# Patient Record
Sex: Male | Born: 1975 | Race: White | Hispanic: No | Marital: Married | Smoking: Never smoker
Health system: Southern US, Community
[De-identification: ages and names within clinical notes are randomized; demographics above are authoritative.]

## PROBLEM LIST (undated history)

## (undated) DIAGNOSIS — E785 Hyperlipidemia, unspecified: Secondary | ICD-10-CM

## (undated) DIAGNOSIS — E7800 Pure hypercholesterolemia, unspecified: Secondary | ICD-10-CM

## (undated) DIAGNOSIS — I10 Essential (primary) hypertension: Secondary | ICD-10-CM

## (undated) HISTORY — DX: Essential (primary) hypertension: I10

## (undated) HISTORY — DX: Hyperlipidemia, unspecified: E78.5

## (undated) HISTORY — DX: Pure hypercholesterolemia, unspecified: E78.00

---

## 2001-08-19 ENCOUNTER — Encounter: Admission: RE | Admit: 2001-08-19 | Discharge: 2001-08-19 | Payer: Self-pay | Admitting: Internal Medicine

## 2001-08-19 ENCOUNTER — Encounter: Payer: Self-pay | Admitting: Internal Medicine

## 2006-01-14 ENCOUNTER — Ambulatory Visit: Payer: Self-pay | Admitting: Internal Medicine

## 2016-11-29 ENCOUNTER — Ambulatory Visit (INDEPENDENT_AMBULATORY_CARE_PROVIDER_SITE_OTHER): Payer: 59 | Admitting: Sports Medicine

## 2016-11-29 ENCOUNTER — Ambulatory Visit
Admission: RE | Admit: 2016-11-29 | Discharge: 2016-11-29 | Disposition: A | Discharge status: Home / Self Care | Payer: 59 | Source: Ambulatory Visit | Attending: Sports Medicine | Admitting: Sports Medicine

## 2016-11-29 ENCOUNTER — Ambulatory Visit: Payer: Self-pay

## 2016-11-29 ENCOUNTER — Encounter: Payer: Self-pay | Admitting: Sports Medicine

## 2016-11-29 VITALS — BP 142/97 | HR 73 | Ht 68.0 in | Wt 180.0 lb

## 2016-11-29 DIAGNOSIS — M722 Plantar fascial fibromatosis: Secondary | ICD-10-CM | POA: Diagnosis not present

## 2016-11-29 DIAGNOSIS — M79672 Pain in left foot: Secondary | ICD-10-CM

## 2016-11-29 DIAGNOSIS — M7732 Calcaneal spur, left foot: Secondary | ICD-10-CM | POA: Diagnosis not present

## 2016-11-30 DIAGNOSIS — M79672 Pain in left foot: Secondary | ICD-10-CM | POA: Insufficient documentation

## 2016-11-30 NOTE — Progress Notes (Signed)
  Mark Jenkins - 29[name]41 y.o. male MRN [id number]  Date of birth: 11/12/1975  SUBJECTIVE:  Including CC & ROS.  CC: left foot pain  [name] is a 41 yo male who is presenting with intermittent left heel pain for the past 8 months.  He does not remember a specific injury, but first felt the pain when he was playing soccer. He reports that the pain is worse on the lateral edge of his heel. It bothers him most in the morning when he first wakes up, then again at night after he has been on his feet all day. He has worn heel pads in his shoes and trialed a plantar fascia support brace which has helped minimally. He ices his foot occasionally after tennis matches and soccer games. No prior foot injuries. No other injuries reported.  ROS: No unexpected weight loss, fever, chills, swelling, instability, muscle pain, numbness/tingling, redness, otherwise see HPI   PMHx - Updated and reviewed.  Contributory factors include: Negative PSHx - Updated and reviewed.  Contributory factors include:  Negative FHx - Updated and reviewed.  Contributory factors include:  Negative Social Hx - Updated and reviewed. Contributory factors include: Negative Medications - reviewed   DATA REVIEWED: N/A  PHYSICAL EXAM:  VS: BP:(!) 142/97  HR:73bpm  TEMP: ( )  RESP:   HT:5\' 8"  (172.7 cm)   WT:180 lb (81.6 kg)  BMI:27.4 PHYSICAL EXAM: Gen: NAD, alert, cooperative with exam, well-appearing HEENT: clear conjunctiva,  CV:  no edema, capillary refill brisk, normal rate Resp: non-labored Skin: no rashes, normal turgor  Neuro: no gross deficits.  Psych:  alert and oriented  Left Foot: No visible erythema or swelling. Range of motion is full in all directions. Strength is 5/5 in all directions. No hallux valgus or abnormal calluses. Pes cavus noted No pain over the navicular prominence, or base of fifth metatarsal Moderate tenderness to palpation of the calcaneal insertion of plantar fascia but more tenderness  over lateral calcaneus. No pain at the Achilles insertion. No pain over the calcaneal bursa. No pain of the retrocalcaneal bursa. No tenderness to palpation over the tarsals, metatarsals, or phalanges. No tenderness palpation over interphalangeal joints. No pain with compression of the metatarsal heads. Neurovascularly intact distally.  Imaging:  U/S L foot: Thickening of plantar fascia noted on left compared to right. Left calcaneus with step off deformity  ASSESSMENT & PLAN:   Left foot pain Left foot pain most consistent with plantar fascitis, given nature of symptoms and thickening of plantar fascia on ultrasound. Although his point tenderness is more lateral than typical pain in plantar fascitis. Given deformity of calcaneus on left, he could also have a stress fracture that is causing pain as well.   Plan: Given rehab exercises for plantar fascitis, instructed to continue to wear shoes with arch support Return in 4 weeks  Patient seen and evaluated with the resident. I agree with the above plan of care. The cortical irregularity seen on his ultrasound is a calcaneal spur. This is confirmed on his x-ray. There is no evidence of stress fracture. Patient will proceed with treatment as above and follow-up in 4 weeks. If he continues to have improvement then we could consider custom orthotics at follow-up.

## 2016-11-30 NOTE — Assessment & Plan Note (Signed)
Left foot pain most consistent with plantar fascitis, given nature of symptoms and thickening of plantar fascia on ultrasound. Although his point tenderness is more lateral than typical pain in plantar fascitis. Given deformity of calcaneus on left, he could also have a stress fracture that is causing pain as well.   Plan: Given rehab exercises for plantar fascitis, instructed to continue to wear shoes with arch support Return in 4 weeks

## 2016-12-27 ENCOUNTER — Ambulatory Visit: Payer: 59 | Admitting: Sports Medicine

## 2017-08-22 IMAGING — CR DG OS CALCIS 2+V*L*
2 series · 2 of 2 positions shown · non-contrast
Comparison: None.

CLINICAL DATA: Left heel pain for 7 months, no injury

EXAM:
LEFT OS CALCIS - 2+ VIEW

[t calcaneus axial left]
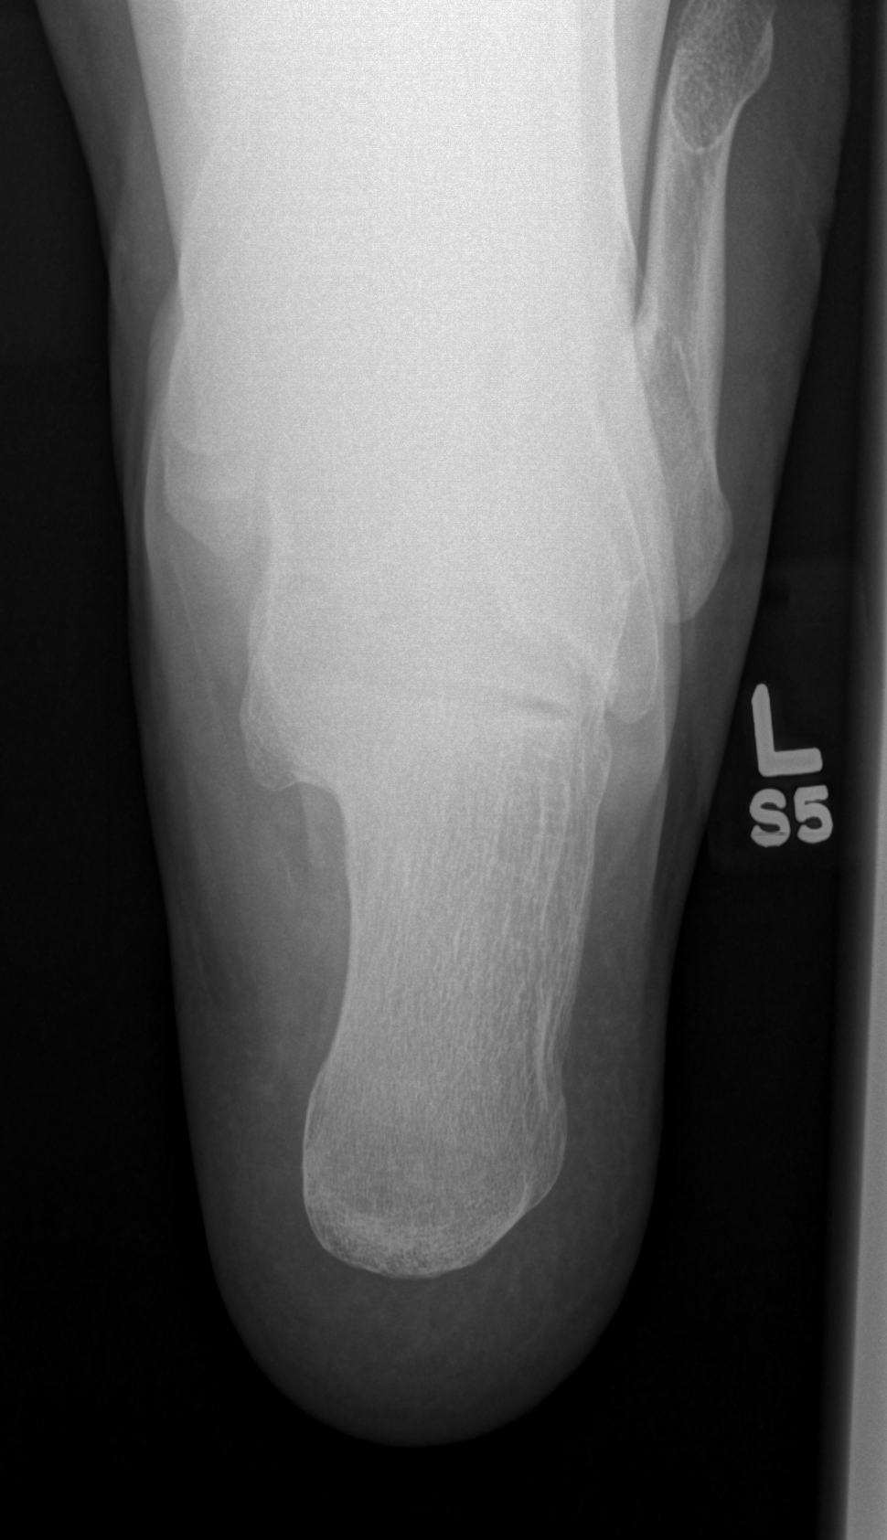

[t calcaneus lat left]
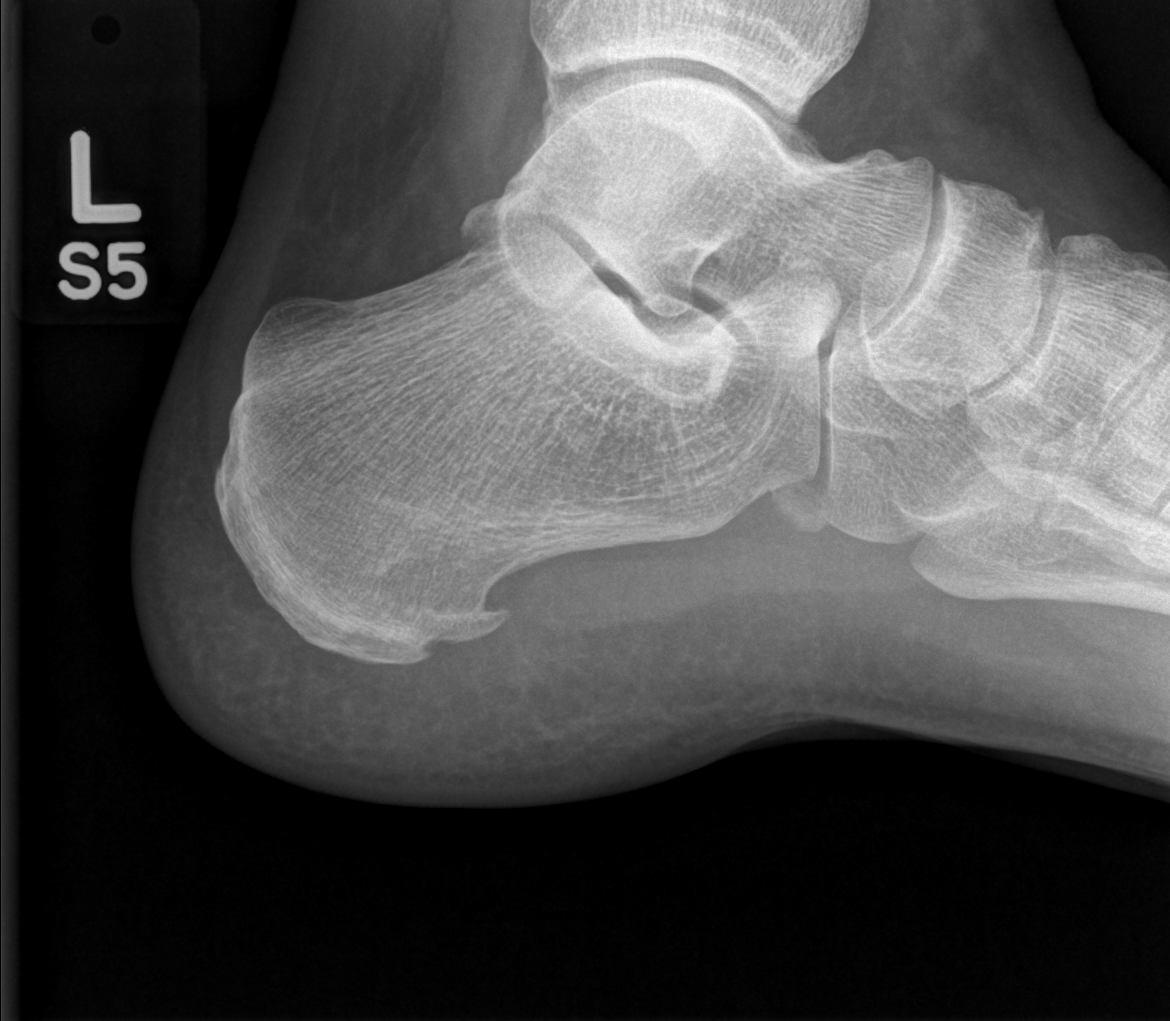

[2 of 2 positions shown; findings below may reference images not displayed]

FINDINGS: There is a small plantar calcaneal degenerative spur present. No
acute abnormality is seen. The sub talar joint space appears normal.
IMPRESSION: Small plantar calcaneal degenerative spur.

## 2022-02-28 ENCOUNTER — Ambulatory Visit: Payer: 59 | Admitting: Gastroenterology

## 2022-02-28 NOTE — Progress Notes (Deleted)
    Gastroenterology Consultation  Referring Provider:     Haywood Pao, MD Primary Care Physician:  Haywood Pao, MD Primary Gastroenterologist:  Dr. Allen Norris     Reason for Consultation:     Change in bowel habits        HPI:   Mark Jenkins is a 46 y.o. y/o male referred for consultation & management of change in bowel habits by Dr. Osborne Casco, Fransico Him, MD. this patient comes in after making appointment because he reported not having a solid bowel movement for few weeks.  At the time he had stated that he was having abdominal pain when he would eat too fast or eat pizza  No past medical history on file.  *** The histories are not reviewed yet. Please review them in the "History" navigator section and refresh this [place].  Prior to Admission medications   Medication Sig Start Date End Date Taking? Authorizing Provider  atorvastatin (LIPITOR) 40 MG tablet  11/01/16   [provider]    No family history on file.   Social History   Tobacco Use   Smoking status: Never   Smokeless tobacco: Never    Allergies as of 02/28/2022   (Not on File)    Review of Systems:    All systems reviewed and negative except where noted in HPI.   Physical Exam:  There were no vitals taken for this visit. No LMP for male patient. General:   Alert,  Well-developed, well-nourished, pleasant and cooperative in NAD Head:  Normocephalic and atraumatic. Eyes:  Sclera clear, no icterus.   Conjunctiva pink. Ears:  Normal auditory acuity. Neck:  Supple; no masses or thyromegaly. Lungs:  Respirations even and unlabored.  Clear throughout to auscultation.   No wheezes, crackles, or rhonchi. No acute distress. Heart:  Regular rate and rhythm; no murmurs, clicks, rubs, or gallops. Abdomen:  Normal bowel sounds.  No bruits.  Soft, non-tender and non-distended without masses, hepatosplenomegaly or hernias noted.  No guarding or rebound tenderness.  Negative Carnett sign.   Rectal:   Deferred.  Pulses:  Normal pulses noted. Extremities:  No clubbing or edema.  No cyanosis. Neurologic:  Alert and oriented x3;  grossly normal neurologically. Skin:  Intact without significant lesions or rashes.  No jaundice. Lymph Nodes:  No significant cervical adenopathy. Psych:  Alert and cooperative. Normal mood and affect.  Imaging Studies: No results found.  Assessment and Plan:   MARWIN COTE is a 46 y.o. y/o male ***    Lucilla Lame, MD. Marval Regal    Note: This dictation was prepared with Dragon dictation along with smaller phrase technology. Any transcriptional errors that result from this process are unintentional.

## 2022-05-24 ENCOUNTER — Encounter: Payer: Self-pay | Admitting: *Deleted

## 2022-06-12 ENCOUNTER — Encounter: Payer: Self-pay | Admitting: Gastroenterology

## 2022-06-22 ENCOUNTER — Ambulatory Visit: Payer: 59 | Attending: Internal Medicine | Admitting: Internal Medicine

## 2022-06-22 ENCOUNTER — Encounter: Payer: Self-pay | Admitting: Internal Medicine

## 2022-06-22 VITALS — BP 146/82 | HR 56 | Ht 67.0 in | Wt 198.0 lb

## 2022-06-22 DIAGNOSIS — E785 Hyperlipidemia, unspecified: Secondary | ICD-10-CM

## 2022-06-22 DIAGNOSIS — Z7689 Persons encountering health services in other specified circumstances: Secondary | ICD-10-CM | POA: Diagnosis not present

## 2022-06-22 LAB — LIPID PANEL
Chol/HDL Ratio: 6.2 ratio — ABNORMAL HIGH (ref 0.0–5.0)
Cholesterol, Total: 273 mg/dL — ABNORMAL HIGH (ref 100–199)
HDL: 44 mg/dL (ref >39)
LDL Chol Calc (NIH): 194 mg/dL — ABNORMAL HIGH (ref 0–99)
Triglycerides: 183 mg/dL — ABNORMAL HIGH (ref 0–149)
VLDL Cholesterol Cal: 35 mg/dL (ref 5–40)

## 2022-06-22 NOTE — Progress Notes (Signed)
Cardiology Office Note:    Date:  06/22/2022   ID:  Mark Jenkins, DOB May 19, 1976, MRN [id number]  PCP:  Gaspar Garbe, MD   [facility] Providers Cardiologist:  Maisie Fus, MD     Referring MD: Gaspar Garbe, MD   No chief complaint on file. Hyperlipidemia  History of Present Illness:    Mark Jenkins is a 46 y.o. 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 He plays tennis, pickle ball and soccer.  He has no DOE and no chest pressure. Normal LFTs. No family hx of CAD. His father had atrial fibrillation. Has a sister and she is healthy. No prior cardiac work up.   03/23/2022 TC 315 LDL 253 HDL 37  Current Medications: Current Meds  Medication Sig   atorvastatin (LIPITOR) 40 MG tablet      Allergies:   Patient has no allergy information on record.   Social History   Socioeconomic History   Marital status: Married    Spouse name: Not on file   Number of children: Not on file   Years of education: Not on file   Highest education level: Not on file  Occupational History   Not on file  Tobacco Use   Smoking status: Never   Smokeless tobacco: Never  Substance and Sexual Activity   Alcohol use: Not on file   Drug use: Not on file   Sexual activity: Not on file  Other Topics Concern   Not on file  Social History Narrative   Not on file   Social Determinants of Health   Financial Resource Strain: Not on file  Food Insecurity: Not on file  Transportation Needs: Not on file  Physical Activity: Not on file  Stress: Not on file  Social Connections: Not on file     Family History: Per above  ROS:   Please see the history of present illness.     All other systems reviewed and are negative.  EKGs/Labs/Other Studies Reviewed:    The following studies were reviewed today:   EKG:  EKG is  ordered today.  The ekg ordered today demonstrates   06/22/2022-Sinus  bradycardia  with sinus arrhythmia   Recent Labs: No results found for requested labs within last 365 days.   Recent Lipid Panel No results found for: "CHOL", "TRIG", "HDL", "CHOLHDL", "VLDL", "LDLCALC", "LDLDIRECT"   Risk Assessment/Calculations:     Physical Exam:    VS:    Vitals:   06/22/22 0931  BP: (!) 146/82  Pulse: (!) 56  SpO2: 98%     Wt Readings from Last 3 Encounters:  06/22/22 198 lb (89.8 kg)  11/29/16 180 lb (81.6 kg)     GEN:  Well nourished, well developed in no acute distress HEENT: Normal NECK: No JVD; No carotid bruits LYMPHATICS: No lymphadenopathy CARDIAC: RRR, no murmurs, rubs, gallops RESPIRATORY:  Clear to auscultation without rales, wheezing or rhonchi  ABDOMEN: Soft, non-tender, non-distended MUSCULOSKELETAL:  No edema; No deformity  SKIN: Warm and dry NEUROLOGIC:  Alert and oriented x 3 PSYCHIATRIC:  Normal affect   ASSESSMENT:    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 He is asymptomatic. Continue atorvastatin 40 mg daily. Will re-assess his lipids and adjust his statin accordingly. PLAN:    In order of problems listed above:  Fasting labs      Medication Adjustments/Labs and Tests Ordered: Current medicines are reviewed  at length with the patient today.  Concerns regarding medicines are outlined above.  Orders Placed This Encounter  Procedures   Lipid panel   EKG 12-Lead   No orders of the defined types were placed in this encounter.   Patient Instructions  Medication Instructions:  Your physician recommends that you continue on your current medications as directed. Please refer to the Current Medication list given to you today.  *If you need a refill on your cardiac medications before your next appointment, please call your pharmacy*   Lab Work: TODAY: Lipids If you have labs (blood work) drawn today and your tests are completely normal, you will receive your results only by: MyChart Message (if you have MyChart) OR A paper  copy in the mail If you have any lab test that is abnormal or we need to change your treatment, we will call you to review the results.   Testing/Procedures: None   Follow-Up: At [facility], you and your health needs are our priority.  As part of our continuing mission to provide you with exceptional heart care, we have created designated Provider Care Teams.  These Care Teams include your primary Cardiologist (physician) and Advanced Practice Providers (APPs -  Physician Assistants and Nurse Practitioners) who all work together to provide you with the care you need, when you need it.  We recommend signing up for the patient portal called "MyChart".  Sign up information is provided on this After Visit Summary.  MyChart is used to connect with patients for Virtual Visits (Telemedicine).  Patients are able to view lab/test results, encounter notes, upcoming appointments, etc.  Non-urgent messages can be sent to your provider as well.   To learn more about what you can do with MyChart, go to [email or web address].    Your next appointment:   6 month(s)  The format for your next appointment:   In Person  Provider:   Maisie Fus, MD     Other Instructions   Important Information About Sugar         Signed, Maisie Fus, MD  06/22/2022 10:02 AM    [facility]

## 2022-06-22 NOTE — Patient Instructions (Signed)
Medication Instructions:  Your physician recommends that you continue on your current medications as directed. Please refer to the Current Medication list given to you today.  *If you need a refill on your cardiac medications before your next appointment, please call your pharmacy*   Lab Work: TODAY: Lipids If you have labs (blood work) drawn today and your tests are completely normal, you will receive your results only by: MyChart Message (if you have MyChart) OR A paper copy in the mail If you have any lab test that is abnormal or we need to change your treatment, we will call you to review the results.   Testing/Procedures: None   Follow-Up: At [facility], you and your health needs are our priority.  As part of our continuing mission to provide you with exceptional heart care, we have created designated Provider Care Teams.  These Care Teams include your primary Cardiologist (physician) and Advanced Practice Providers (APPs -  Physician Assistants and Nurse Practitioners) who all work together to provide you with the care you need, when you need it.  We recommend signing up for the patient portal called "MyChart".  Sign up information is provided on this After Visit Summary.  MyChart is used to connect with patients for Virtual Visits (Telemedicine).  Patients are able to view lab/test results, encounter notes, upcoming appointments, etc.  Non-urgent messages can be sent to your provider as well.   To learn more about what you can do with MyChart, go to [email or web address].    Your next appointment:   6 month(s)  The format for your next appointment:   In Person  Provider:   Maisie Fus, MD     Other Instructions   Important Information About Sugar

## 2022-07-02 ENCOUNTER — Other Ambulatory Visit: Payer: Self-pay | Admitting: *Deleted

## 2022-07-02 DIAGNOSIS — E785 Hyperlipidemia, unspecified: Secondary | ICD-10-CM

## 2022-07-02 MED ORDER — ATORVASTATIN CALCIUM 80 MG PO TABS: 80.0000 mg | ORAL_TABLET | Freq: Every day | ORAL | 3 refills | Status: AC

## 2022-07-25 ENCOUNTER — Encounter: Payer: Self-pay | Admitting: Gastroenterology

## 2022-07-25 ENCOUNTER — Ambulatory Visit: Payer: 59 | Admitting: Gastroenterology

## 2022-07-25 VITALS — BP 132/82 | HR 72 | Ht 67.0 in | Wt 201.2 lb

## 2022-07-25 DIAGNOSIS — R1319 Other dysphagia: Secondary | ICD-10-CM

## 2022-07-25 DIAGNOSIS — Z1211 Encounter for screening for malignant neoplasm of colon: Secondary | ICD-10-CM

## 2022-07-25 DIAGNOSIS — R12 Heartburn: Secondary | ICD-10-CM

## 2022-07-25 MED ORDER — NA SULFATE-K SULFATE-MG SULF 17.5-3.13-1.6 GM/177ML PO SOLN: 1.0000 | Freq: Once | ORAL | 0 refills | Status: AC

## 2022-07-25 MED ORDER — ONDANSETRON HCL 4 MG PO TABS: ORAL_TABLET | ORAL | 0 refills | Status: AC

## 2022-07-25 NOTE — Patient Instructions (Addendum)
It was my pleasure to provide care to you today. Based on our discussion, I am providing you with my recommendations below:  RECOMMENDATION(S):   Continue to take your omeprazole every day.  Take small bites, eat slowly, and chew well.  Take sips of water between bites.  Remain upright after eating. You should maintain postures that reduce the risk for reflux for at least three hours after eating. For example, don't bend over or strain to lift heavy objects.  Avoid eating within three hours of going to bed. Lying down after eating will increase chances of reflux.  Lose weight. Excess pounds increase pressure on the stomach and can push acid into the esophagus.  Loosen up. Avoid tight belts, waistbands, and other clothing that puts pressure on your stomach.  Avoid foods that burn. Abstain from food or drink that increases gastric acid secretion, decreases the valve at the bottom of the esophagus, or slows the emptying of the stomach. Known offenders include high-fat foods, spicy dishes, tomatoes and tomato products, citrus fruits, garlic, onions, milk, carbonated drinks, coffee (including decaf), tea, chocolate, mints, and alcohol.  Sleep at an angle. If you're bothered by nighttime heartburn, place a wedge (available in medical supply stores or a wedge pillow through [employer]) under your upper body. But don't elevate your head with extra pillows. That makes reflux worse by bending you at the waist and compressing your stomach. You might also try sleeping on your left side, as studies have shown this can help--perhaps because the stomach is on the left side of the body, so lying on your left positions most of the stomach below the bottom of the esophagus.   COLONOSCOPY:   You have been scheduled for a colonoscopy. Please follow written instructions given to you at your visit today.   PREP:   Please pick up your prep supplies at the pharmacy within the next 1-3 days.  INHALERS:   If you use  inhalers (even only as needed), please bring them with you on the day of your procedure.   COLONOSCOPY TIPS:  To reduce nausea and dehydration, stay well hydrated for 3-4 days prior to the exam.  To prevent skin/hemorrhoid irritation - prior to wiping, put A&Dointment or vaseline on the toilet paper. Keep a towel or pad on the bed.  BEFORE STARTING YOUR PREP, drink  64oz of clear liquids in the morning. This will help to flush the colon and will ensure you are well hydrated!!!!  NOTE - This is in addition to the fluids required for to complete your prep. Use of a flavored hard candy, such as grape Anise Salvo, can counteract some of the flavor of the prep and may prevent some nausea.    FOLLOW UP:  After your procedure, you will receive a call from my office staff regarding my recommendation for follow up.  BMI:  If you are age 69 or older, your body mass index should be between 23-30. Your Body mass index is 31.52 kg/m. If this is out of the aforementioned range listed, please consider follow up with your Primary Care Provider.  If you are age 82 or younger, your body mass index should be between 19-25. Your Body mass index is 31.52 kg/m. If this is out of the aformentioned range listed, please consider follow up with your Primary Care Provider.   MY CHART:  The [facility] providers would like to encourage you to use [facility] to communicate with providers for non-urgent requests or questions.  Due to long hold times on the telephone, sending your provider a message by [facility] may be a faster and more efficient way to get a response.  Please allow 48 business hours for a response.  Please remember that this is for non-urgent requests.   Thank you for trusting me with your gastrointestinal care!    Tressia Danas, MD, MPH

## 2022-07-25 NOTE — Progress Notes (Signed)
Referring Provider: Gaspar Garbe, MD Primary Care Physician:  Gaspar Garbe, MD  Reason for Consultation: Reflux, colon cancer screening   IMPRESSION:  Reflux with intermittent dysphagia.  EGD with possible balloon dilation recommended to evaluate for stricture, ring, web, erosive esophagitis, and eosinophilic esophagitis particularly given his history of allergies.  Need for colon cancer screening.  Colonoscopy recommended.  Family history of colon cancer (paternal grandfather)  PLAN: - Continue omeprazole - Reflux lifestyle modifications - EGD - Screening colonoscopy - Zofran provided for minimize prep side effects   HPI: Mark Jenkins is a 46 y.o. male referred by Dr. Wylene Simmer for reflux and colon cancer screening.  The history is obtained through the patient, review of his electronic health record, and records provided by Dr. Wylene Simmer.  He has hypertension and hyperlipidemia.  He has been having reflux predominantly in the evenings. Started around Christmas.  Some nocturnal symptoms with choking and smothering. Solid foods including meat and rice sometimes gets stuck in different areas of the esophagus.  Dysphagia is overall rare but can be severe enough that he essentially has to regurgitate the food.  No odynophagia, neck pain, sore throat, cough, blood in the stool, or change in bowel habits. He used probiotics and apple cider vinegar and omeprazole 40 mg QHS. He has adjusted his diet to minimize symptoms.  Dr. Wylene Simmer recommended that he take the omeprazole daily instead of using it on a as needed basis.  This is reduced his nocturnal symptoms.  Paternal grandfather with colon cancer. Father with prostate cancer.  There is no other known family history of colon cancer or polyps. No family history of stomach cancer or other GI malignancy. No family history of inflammatory bowel disease or celiac.   Recent labs included in the referral records show normal liver  enzymes and a normal CBC.  H. pylori antibodies were negative. Past Medical History:  Diagnosis Date   Elevated cholesterol    High blood pressure    Hyperlipidemia     History reviewed. No pertinent surgical history.   Current Outpatient Medications  Medication Sig Dispense Refill   atorvastatin (LIPITOR) 80 MG tablet Take 1 tablet (80 mg total) by mouth daily. 90 tablet 3   Bacillus Coagulans-Inulin (PROBIOTIC) 1-250 BILLION-MG CAPS Take 1 capsule by mouth daily.     cholecalciferol (VITAMIN D3) 25 MCG (1000 UNIT) tablet Take 1,000 Units by mouth daily.     fluticasone (FLONASE) 50 MCG/ACT nasal spray Place 1 spray into both nostrils daily as needed for allergies or rhinitis.     loratadine (CLARITIN) 10 MG tablet Take 10 mg by mouth daily.     Magnesium 100 MG CAPS Take 1 capsule by mouth daily.     vitamin C (ASCORBIC ACID) 250 MG tablet Take 500 mg by mouth daily.     No current facility-administered medications for this visit.    Allergies as of 07/25/2022   (Not on File)    Family History  Problem Relation Age of Onset   Prostate cancer Father    Hypertension Father    Atrial fibrillation Father    Stroke Father    Leukemia Father    Colon cancer Paternal Grandfather     Social History   Socioeconomic History   Marital status: Married    Spouse name: Not on file   Number of children: 1   Years of education: Not on file   Highest education level: Not on file  Occupational History  Not on file  Tobacco Use   Smoking status: Never   Smokeless tobacco: Never  Substance and Sexual Activity   Alcohol use: Not on file   Drug use: Not on file   Sexual activity: Not on file  Other Topics Concern   Not on file  Social History Narrative   Not on file   Social Determinants of Health   Financial Resource Strain: Not on file  Food Insecurity: Not on file  Transportation Needs: Not on file  Physical Activity: Not on file  Stress: Not on file  Social  Connections: Not on file  Intimate Partner Violence: Not on file    Review of Systems: 12 system ROS is negative except as noted above except for allergies.   Physical Exam: General:   Alert,  well-nourished, pleasant and cooperative in NAD Head:  Normocephalic and atraumatic. Eyes:  Sclera clear, no icterus.   Conjunctiva pink. Ears:  Normal auditory acuity. Nose:  No deformity, discharge,  or lesions. Mouth:  No deformity or lesions.   Neck:  Supple; no masses or thyromegaly. Lungs:  Clear throughout to auscultation.   No wheezes. Heart:  Regular rate and rhythm; no murmurs. Abdomen:  Soft, nontender, nondistended, normal bowel sounds, no rebound or guarding. No hepatosplenomegaly.   Rectal:  Deferred  Msk:  Symmetrical. No boney deformities LAD: No inguinal or umbilical LAD Extremities:  No clubbing or edema. Neurologic:  Alert and  oriented x4;  grossly nonfocal Skin:  Intact without significant lesions or rashes. Psych:  Alert and cooperative. Normal mood and affect.    Kripa Foskey L. Tarri Glenn, MD, MPH 07/25/2022, 9:55 AM

## 2022-08-21 ENCOUNTER — Ambulatory Visit (AMBULATORY_SURGERY_CENTER): Payer: 59 | Admitting: Gastroenterology

## 2022-08-21 ENCOUNTER — Encounter: Payer: Self-pay | Admitting: Gastroenterology

## 2022-08-21 VITALS — BP 134/89 | HR 59 | Temp 97.5°F | Resp 12 | Ht 67.0 in | Wt 201.0 lb

## 2022-08-21 DIAGNOSIS — K31819 Angiodysplasia of stomach and duodenum without bleeding: Secondary | ICD-10-CM | POA: Diagnosis not present

## 2022-08-21 DIAGNOSIS — R12 Heartburn: Secondary | ICD-10-CM | POA: Diagnosis not present

## 2022-08-21 DIAGNOSIS — Z1211 Encounter for screening for malignant neoplasm of colon: Secondary | ICD-10-CM

## 2022-08-21 DIAGNOSIS — R1319 Other dysphagia: Secondary | ICD-10-CM

## 2022-08-21 MED ORDER — SODIUM CHLORIDE 0.9 % IV SOLN: 500.0000 mL | Freq: Once | INTRAVENOUS | Status: DC

## 2022-08-21 NOTE — Progress Notes (Addendum)
Indication for EGD: Dysphagia, reflux Indication for colonoscopy: Colon cancer screening  See 07/25/22 office note for complete details. There has been no change in history or physical exam since that time. He remains an appropriate candidate for monitored anesthesia care in the [facility].

## 2022-08-21 NOTE — Progress Notes (Signed)
Vss nad trans to pacu °

## 2022-08-21 NOTE — Patient Instructions (Signed)
Try to increase the fiber in your diet, and drink at least 64 oz of water per day.  Try metamucil or benefiber.  Eating fruits and vegetables would be best.  Resume your previous medications.  Read all of the handouts given to you by your recovery room nurse.  YOU HAD AN ENDOSCOPIC PROCEDURE TODAY AT [facility]:   Refer to the procedure report that was given to you for any specific questions about what was found during the examination.  If the procedure report does not answer your questions, please call your gastroenterologist to clarify.  If you requested that your care partner not be given the details of your procedure findings, then the procedure report has been included in a sealed envelope for you to review at your convenience later.  YOU SHOULD EXPECT: Some feelings of bloating in the abdomen. Passage of more gas than usual.  Walking can help get rid of the air that was put into your GI tract during the procedure and reduce the bloating. If you had a lower endoscopy (such as a colonoscopy or flexible sigmoidoscopy) you may notice spotting of blood in your stool or on the toilet paper. If you underwent a bowel prep for your procedure, you may not have a normal bowel movement for a few days.  Please Note:  You might notice some irritation and congestion in your nose or some drainage.  This is from the oxygen used during your procedure.  There is no need for concern and it should clear up in a day or so.  SYMPTOMS TO REPORT IMMEDIATELY:  Following lower endoscopy (colonoscopy or flexible sigmoidoscopy):  Excessive amounts of blood in the stool  Significant tenderness or worsening of abdominal pains  Swelling of the abdomen that is new, acute  Fever of 100F or higher  Following upper endoscopy (EGD)  Vomiting of blood or coffee ground material  New chest pain or pain under the shoulder blades  Painful or persistently difficult swallowing  New shortness of breath  Fever of  100F or higher  Black, tarry-looking stools  For urgent or emergent issues, a gastroenterologist can be reached at any hour by calling [phone]. Do not use MyChart messaging for urgent concerns.    DIET:  We do recommend a small meal at first, but then you may proceed to your regular diet.  Drink plenty of fluids but you should avoid alcoholic beverages for 24 hours.  Try to increase the fiber in your diet, and drink plenty of water.  ACTIVITY:  You should plan to take it easy for the rest of today and you should NOT DRIVE or use heavy machinery until tomorrow (because of the sedation medicines used during the test).    FOLLOW UP: Our staff will call the number listed on your records the next business day following your procedure.  We will call around 7:15- 8:00 am to check on you and address any questions or concerns that you may have regarding the information given to you following your procedure. If we do not reach you, we will leave a message.     If any biopsies were taken you will be contacted by phone or by letter within the next 1-3 weeks.  Please call us at [phone] if you have not heard about the biopsies in 3 weeks.    SIGNATURES/CONFIDENTIALITY: You and/or your care partner have signed paperwork which will be entered into your electronic medical record.  These signatures attest to  the fact that that the information above on your After Visit Summary has been reviewed and is understood.  Full responsibility of the confidentiality of this discharge information lies with you and/or your care-partner.

## 2022-08-21 NOTE — Op Note (Signed)
[facility] Patient Name: Mark Jenkins Procedure Date: 08/21/2022 9:16 AM MRN: [id number] Endoscopist: Tressia Danas MD, MD, [id number] Age: 46 Referring MD:  Date of Birth: [birth year] Gender: Male Account #: [id number] Procedure:                Upper GI endoscopy Indications:              Dysphagia, Heartburn Medicines:                Monitored Anesthesia Care Procedure:                Pre-Anesthesia Assessment:                           - Prior to the procedure, a History and Physical                            was performed, and patient medications and                            allergies were reviewed. The patient's tolerance of                            previous anesthesia was also reviewed. The risks                            and benefits of the procedure and the sedation                            options and risks were discussed with the patient.                            All questions were answered, and informed consent                            was obtained. Prior Anticoagulants: The patient has                            taken no anticoagulant or antiplatelet agents. ASA                            Grade Assessment: II - A patient with mild systemic                            disease. After reviewing the risks and benefits,                            the patient was deemed in satisfactory condition to                            undergo the procedure.                           After obtaining informed consent, the endoscope was  passed under direct vision. Throughout the                            procedure, the patient's blood pressure, pulse, and                            oxygen saturations were monitored continuously. The                            Endoscope was introduced through the mouth, and                            advanced to the third part of duodenum. The upper                            GI endoscopy was  accomplished without difficulty.                            The patient tolerated the procedure well. Scope In: Scope Out: Findings:                 The esophageal lumen and mucosa were normal. The                            z-line is located 36 cm from the incisors. No                            endoscopic abnormality was evident in the esophagus                            to explain the patient's complaint of dysphagia. It                            was decided, however, to proceed with dilation of                            the lower third of the esophagus. A TTS dilator was                            passed through the scope. Dilation with a 16-17-18                            mm balloon dilator was performed to 18 mm. A fully                            inflated balloon was pulled through the lower                            esophagus with no resistance. Biopsies were                            obtained from the mid/proximal and distal esophagus  with cold forceps for histology of suspected                            eosinophilic esophagitis.                           Diffuse mildly erythematous mucosa without bleeding                            was found in the gastric body. Biopsies were taken                            fro mthe antrum, body, and fundus with a cold                            forceps for histology. Estimated blood loss was                            minimal.                           The cardia and gastric fundus were normal on                            retroflexion.                           The examined duodenum was normal. Complications:            No immediate complications. Estimated Blood Loss:     Estimated blood loss was minimal. Impression:               - No endoscopic esophageal abnormality to explain                            patient's dysphagia. Esophagus dilated. Biopsied.                           - Erythematous mucosa in  the gastric body. Biopsied.                           - Normal examined duodenum.                           - Biopsies were taken with a cold forceps for                            evaluation of eosinophilic esophagitis. Recommendation:           - Patient has a contact number available for                            emergencies. The signs and symptoms of potential                            delayed complications were discussed with the  patient. Return to normal activities tomorrow.                            Written discharge instructions were provided to the                            patient.                           - Resume previous diet.                           - Continue present medications.                           - Await pathology results. Tressia Danas MD, MD 08/21/2022 9:45:42 AM This report has been signed electronically.

## 2022-08-21 NOTE — Op Note (Signed)
[facility] Patient Name: Mark Jenkins Procedure Date: 08/21/2022 9:15 AM MRN: [id number] Endoscopist: Tressia Danas MD, MD, [id number] Age: 46 Referring MD:  Date of Birth: 01/24/1976 Gender: Male Account #: [id number] Procedure:                Colonoscopy Indications:              Screening for colorectal malignant neoplasm, This                            is the patient's first colonoscopy Medicines:                Monitored Anesthesia Care Procedure:                Pre-Anesthesia Assessment:                           - Prior to the procedure, a History and Physical                            was performed, and patient medications and                            allergies were reviewed. The patient's tolerance of                            previous anesthesia was also reviewed. The risks                            and benefits of the procedure and the sedation                            options and risks were discussed with the patient.                            All questions were answered, and informed consent                            was obtained. Prior Anticoagulants: The patient has                            taken no anticoagulant or antiplatelet agents. ASA                            Grade Assessment: II - A patient with mild systemic                            disease. After reviewing the risks and benefits,                            the patient was deemed in satisfactory condition to                            undergo the procedure.  After obtaining informed consent, the colonoscope                            was passed under direct vision. Throughout the                            procedure, the patient's blood pressure, pulse, and                            oxygen saturations were monitored continuously. The                            CF [id number] #[id number] was introduced through the anus                            and advanced  to the 3 cm into the ileum. A second                            forward view of the right colon was performed. The                            colonoscopy was performed without difficulty. The                            patient tolerated the procedure well. The quality                            of the bowel preparation was good. The terminal                            ileum, ileocecal valve, appendiceal orifice, and                            rectum were photographed. Scope In: 9:32:58 AM Scope Out: 9:42:52 AM Scope Withdrawal Time: 0 hours 6 minutes 17 seconds  Total Procedure Duration: 0 hours 9 minutes 54 seconds  Findings:                 The perianal and digital rectal examinations were                            normal.                           Multiple large-mouthed, medium-mouthed and                            small-mouthed diverticula were found in the sigmoid                            colon and descending colon.                           The exam was otherwise without abnormality on  direct and retroflexion views. Complications:            No immediate complications. Estimated Blood Loss:     Estimated blood loss: none. Impression:               - Diverticulosis in the sigmoid colon and in the                            descending colon.                           - The examination was otherwise normal on direct                            and retroflexion views.                           - No specimens collected. Recommendation:           - Patient has a contact number available for                            emergencies. The signs and symptoms of potential                            delayed complications were discussed with the                            patient. Return to normal activities tomorrow.                            Written discharge instructions were provided to the                            patient.                           - High  fiber diet.                           - Continue present medications.                           - Repeat colonoscopy in 10 years for surveillance.                           - Follow a high fiber diet. Drink at least 64                            ounces of water daily. Add a daily stool bulking                            agent such as psyllium (an exampled would be                            Metamucil).                           -  Emerging evidence supports eating a diet of                            fruits, vegetables, grains, calcium, and yogurt                            while reducing red meat and alcohol may reduce the                            risk of colon cancer.                           - Thank you for allowing me to be involved in your                            colon cancer prevention. Tressia Danas MD, MD 08/21/2022 9:51:47 AM This report has been signed electronically.

## 2022-08-21 NOTE — Progress Notes (Signed)
Called to room to assist during endoscopic procedure.  Patient ID and intended procedure confirmed with present staff. Received instructions for my participation in the procedure from the performing physician.  

## 2022-08-22 ENCOUNTER — Telehealth: Payer: Self-pay

## 2022-08-22 NOTE — Telephone Encounter (Signed)
Post procedure follow up call, no answer 

## 2022-08-24 ENCOUNTER — Encounter: Payer: Self-pay | Admitting: Gastroenterology
